# Patient Record
Sex: Male | Born: 1970 | Race: White | Hispanic: No | Marital: Married | Smoking: Current every day smoker
Health system: Southern US, Community
[De-identification: ages and names within clinical notes are randomized; demographics above are authoritative.]

## PROBLEM LIST (undated history)

## (undated) DIAGNOSIS — G8929 Other chronic pain: Secondary | ICD-10-CM

## (undated) DIAGNOSIS — M549 Dorsalgia, unspecified: Secondary | ICD-10-CM

---

## 2002-08-20 ENCOUNTER — Encounter: Payer: Self-pay | Admitting: Emergency Medicine

## 2002-08-20 ENCOUNTER — Emergency Department (HOSPITAL_COMMUNITY): Admission: EM | Admit: 2002-08-20 | Discharge: 2002-08-20 | Payer: Self-pay | Admitting: Emergency Medicine

## 2002-09-15 ENCOUNTER — Emergency Department (HOSPITAL_COMMUNITY): Admission: EM | Admit: 2002-09-15 | Discharge: 2002-09-15 | Payer: Self-pay | Admitting: Emergency Medicine

## 2002-09-15 ENCOUNTER — Encounter: Payer: Self-pay | Admitting: Emergency Medicine

## 2003-09-06 ENCOUNTER — Emergency Department (HOSPITAL_COMMUNITY): Admission: EM | Admit: 2003-09-06 | Discharge: 2003-09-06 | Payer: Self-pay | Admitting: Emergency Medicine

## 2003-11-30 ENCOUNTER — Emergency Department (HOSPITAL_COMMUNITY): Admission: EM | Admit: 2003-11-30 | Discharge: 2003-12-01 | Payer: Self-pay | Admitting: Emergency Medicine

## 2003-12-04 ENCOUNTER — Ambulatory Visit (HOSPITAL_COMMUNITY): Admission: RE | Admit: 2003-12-04 | Discharge: 2003-12-04 | Payer: Self-pay | Admitting: Internal Medicine

## 2003-12-04 ENCOUNTER — Encounter: Admission: RE | Admit: 2003-12-04 | Discharge: 2003-12-04 | Payer: Self-pay | Admitting: Internal Medicine

## 2003-12-13 ENCOUNTER — Encounter: Admission: RE | Admit: 2003-12-13 | Discharge: 2003-12-13 | Payer: Self-pay | Admitting: Internal Medicine

## 2005-04-05 ENCOUNTER — Emergency Department (HOSPITAL_COMMUNITY): Admission: EM | Admit: 2005-04-05 | Discharge: 2005-04-05 | Payer: Self-pay | Admitting: *Deleted

## 2005-04-08 ENCOUNTER — Emergency Department (HOSPITAL_COMMUNITY): Admission: EM | Admit: 2005-04-08 | Discharge: 2005-04-09 | Payer: Self-pay | Admitting: Emergency Medicine

## 2010-12-29 ENCOUNTER — Emergency Department (HOSPITAL_COMMUNITY): Payer: Self-pay

## 2010-12-29 ENCOUNTER — Emergency Department (HOSPITAL_COMMUNITY)
Admission: EM | Admit: 2010-12-29 | Discharge: 2010-12-29 | Disposition: A | Discharge status: Home / Self Care | Payer: Self-pay | Attending: General Surgery | Admitting: General Surgery

## 2010-12-29 DIAGNOSIS — W1789XA Other fall from one level to another, initial encounter: Secondary | ICD-10-CM | POA: Insufficient documentation

## 2010-12-29 DIAGNOSIS — S0120XA Unspecified open wound of nose, initial encounter: Secondary | ICD-10-CM | POA: Insufficient documentation

## 2010-12-29 DIAGNOSIS — S4350XA Sprain of unspecified acromioclavicular joint, initial encounter: Secondary | ICD-10-CM | POA: Insufficient documentation

## 2010-12-29 DIAGNOSIS — S42109A Fracture of unspecified part of scapula, unspecified shoulder, initial encounter for closed fracture: Secondary | ICD-10-CM | POA: Insufficient documentation

## 2010-12-29 LAB — COMPREHENSIVE METABOLIC PANEL
ALT: 14 U/L (ref 0–53)
AST: 28 U/L (ref 0–37)
CO2: 22 meq/L (ref 19–32)
Chloride: 108 meq/L (ref 96–112)
Creatinine, Ser: 1.1 mg/dL (ref 0.4–1.5)
GFR calc Af Amer: >60 mL/min (ref >60)
GFR calc non Af Amer: >60 mL/min (ref >60)
Glucose, Bld: 230 mg/dL — ABNORMAL HIGH (ref 70–99)
Total Bilirubin: 0.4 mg/dL (ref 0.3–1.2)

## 2010-12-29 LAB — CBC
HCT: 38.7 % — ABNORMAL LOW (ref 39.0–52.0)
Hemoglobin: 13.1 g/dL (ref 13.0–17.0)
MCH: 29.6 pg (ref 26.0–34.0)
RBC: 4.43 MIL/uL (ref 4.22–5.81)

## 2010-12-29 MED ORDER — IOHEXOL 300 MG/ML  SOLN
75.0000 mL | Freq: Once | INTRAMUSCULAR | Status: AC | PRN
  Administered 2010-12-29: 75 mL via INTRAVENOUS

## 2011-01-25 ENCOUNTER — Emergency Department (HOSPITAL_COMMUNITY)
Admission: EM | Admit: 2011-01-25 | Discharge: 2011-01-25 | Disposition: A | Discharge status: Home / Self Care | Payer: No Typology Code available for payment source | Attending: Emergency Medicine | Admitting: Emergency Medicine

## 2011-01-25 ENCOUNTER — Emergency Department (HOSPITAL_COMMUNITY): Payer: No Typology Code available for payment source

## 2011-01-25 DIAGNOSIS — M25519 Pain in unspecified shoulder: Secondary | ICD-10-CM | POA: Insufficient documentation

## 2011-01-25 DIAGNOSIS — W208XXA Other cause of strike by thrown, projected or falling object, initial encounter: Secondary | ICD-10-CM | POA: Insufficient documentation

## 2011-01-25 DIAGNOSIS — M545 Low back pain, unspecified: Secondary | ICD-10-CM | POA: Insufficient documentation

## 2011-01-25 DIAGNOSIS — M542 Cervicalgia: Secondary | ICD-10-CM | POA: Insufficient documentation

## 2011-01-25 DIAGNOSIS — S0990XA Unspecified injury of head, initial encounter: Secondary | ICD-10-CM | POA: Insufficient documentation

## 2011-01-25 DIAGNOSIS — Z79899 Other long term (current) drug therapy: Secondary | ICD-10-CM | POA: Insufficient documentation

## 2011-01-25 DIAGNOSIS — M26609 Unspecified temporomandibular joint disorder, unspecified side: Secondary | ICD-10-CM | POA: Insufficient documentation

## 2011-01-25 DIAGNOSIS — G8929 Other chronic pain: Secondary | ICD-10-CM | POA: Insufficient documentation

## 2011-01-25 DIAGNOSIS — R51 Headache: Secondary | ICD-10-CM | POA: Insufficient documentation

## 2012-02-12 ENCOUNTER — Emergency Department (HOSPITAL_BASED_OUTPATIENT_CLINIC_OR_DEPARTMENT_OTHER)
Admission: EM | Admit: 2012-02-12 | Discharge: 2012-02-12 | Disposition: A | Discharge status: Home / Self Care | Payer: Self-pay | Attending: Emergency Medicine | Admitting: Emergency Medicine

## 2012-02-12 ENCOUNTER — Encounter (HOSPITAL_BASED_OUTPATIENT_CLINIC_OR_DEPARTMENT_OTHER): Payer: Self-pay

## 2012-02-12 DIAGNOSIS — F17200 Nicotine dependence, unspecified, uncomplicated: Secondary | ICD-10-CM | POA: Insufficient documentation

## 2012-02-12 DIAGNOSIS — K047 Periapical abscess without sinus: Secondary | ICD-10-CM | POA: Insufficient documentation

## 2012-02-12 DIAGNOSIS — K029 Dental caries, unspecified: Secondary | ICD-10-CM | POA: Insufficient documentation

## 2012-02-12 MED ORDER — HYDROCODONE-ACETAMINOPHEN 5-500 MG PO TABS: 1.0000 | ORAL_TABLET | Freq: Four times a day (QID) | ORAL | Status: AC | PRN

## 2012-02-12 MED ORDER — CLINDAMYCIN HCL 150 MG PO CAPS: 150.0000 mg | ORAL_CAPSULE | Freq: Four times a day (QID) | ORAL | Status: AC

## 2012-02-12 NOTE — Discharge Instructions (Signed)
Abscessed Tooth A tooth abscess is a collection of infected fluid (pus) from a bacterial infection in the inner part of the tooth (pulp). It usually occurs at the end of the tooth's root.  CAUSES   A very bad cavity (extensive tooth decay).   Trauma to the tooth, such as a broken or chipped tooth, that allows bacteria to enter into the pulp.  SYMPTOMS  Severe pain in and around the infected tooth.   Swelling and redness around the abscessed tooth or in the mouth or face.   Tenderness.   Pus drainage.   Bad breath.   Bitter taste in the mouth.   Difficulty swallowing.   Difficulty opening the mouth.   Feeling sick to your stomach (nauseous).   Vomiting.   Chills.   Swollen neck glands.  DIAGNOSIS  A medical and dental history will be taken.   An examination will be performed by tapping on the abscessed tooth.   X-rays may be taken of the tooth to identify the abscess.  TREATMENT The goal of treatment is to eliminate the infection.   You may be prescribed antibiotic medicine to stop the infection from spreading.   A root canal may be performed to save the tooth. If the tooth cannot be saved, it may be pulled (extracted) and the abscess may be drained.  HOME CARE INSTRUCTIONS  Only take over-the-counter or prescription medicines for pain, fever, or discomfort as directed by your caregiver.   Do not drive after taking pain medicine (narcotics).   Rinse your mouth (gargle) often with salt water ( tsp salt in 8 oz of warm water) to relieve pain or swelling.   Do not apply heat to the outside of your face.   Return to your dentist for further treatment as directed.  SEEK IMMEDIATE DENTAL CARE IF:  You have a temperature by mouth above 102 F (38.9 C), not controlled by medicine.   You have chills or a very bad headache.   You have problems breathing or swallowing.   Your have trouble opening your mouth.   You develop swelling in the neck or around the eye.    Your pain is not helped by medicine.   Your pain is getting worse instead of better.  Document Released: 10/04/2005 Document Revised: 09/23/2011 Document Reviewed: 01/12/2011 Exit[employer] Patient Information 2012 [employer]. 

## 2012-02-12 NOTE — ED Notes (Signed)
Pt states that he has a tooth that needs to be pulled, presents with obvious swelling to R upper jaw/face.  Pt states that he does not have a dentist and has not sought care for this abscess/toothache prior to today.  Pt states that he cannot afford dental care.

## 2012-02-12 NOTE — ED Provider Notes (Signed)
Medical screening examination/treatment/procedure(s) were performed by non-physician practitioner and as supervising physician I was immediately available for consultation/collaboration.  Doug Sou, MD 02/12/12 1539

## 2012-02-12 NOTE — ED Provider Notes (Signed)
History     CSN: [id number]  Arrival date & time 02/12/12  1131   First MD Initiated Contact with Patient 02/12/12 1159      Chief Complaint  Patient presents with  . Abscess    (Consider location/radiation/quality/duration/timing/severity/associated sxs/prior treatment) HPI Comments: Pt states that he had a problem previously with right upper tooth, and now has swelling to the cheek:pt denies fever:pt states that he doesn't have a dentist  Patient is a 41 y.o. male presenting with abscess. The history is provided by the patient. No language interpreter was used.  Abscess  This is a new problem. The current episode started yesterday. The problem occurs continuously. The problem has been rapidly worsening. The abscess is present on the face. The abscess is characterized by painfulness and swelling. It is unknown what he was exposed to.    History reviewed. No pertinent past medical history.  History reviewed. No pertinent past surgical history.  History reviewed. No pertinent family history.  History  Substance Use Topics  . Smoking status: Current Everyday Smoker -- 1.0 packs/day    Types: Cigarettes  . Smokeless tobacco: Current User  . Alcohol Use: No      Review of Systems  Constitutional: Negative.   Eyes: Negative.   Respiratory: Negative.   Cardiovascular: Negative.   Neurological: Negative.     Allergies  Review of patient's allergies indicates no known allergies.  Home Medications   Current Outpatient Rx  Name Route Sig Dispense Refill  . DIAZEPAM 5 MG PO TABS Oral Take 5 mg by mouth daily as needed.    . OXYCODONE HCL ER 20 MG PO TB12 Oral Take 20 mg by mouth 3 (three) times daily as needed.      BP 105/66  Pulse 80  Temp(Src) 97.5 F (36.4 C) (Oral)  Resp 18  Ht 6' (1.829 m)  Wt 180 lb (81.647 kg)  BMI 24.41 kg/m2  SpO2 98%  Physical Exam  Nursing note and vitals reviewed. Constitutional: He is oriented to person, place, and time. He  appears well-developed and well-nourished.  HENT:       Pt has swelling noted to the right cheek:no obvious:pt has generalized decay without definitive tooth  Eyes: Conjunctivae and EOM are normal.  Neck: Neck supple.  Cardiovascular: Normal rate and regular rhythm.   Pulmonary/Chest: Effort normal and breath sounds normal.  Musculoskeletal: Normal range of motion.  Neurological: He is alert and oriented to person, place, and time.    ED Course  Procedures (including critical care time)  Labs Reviewed - No data to display No results found.   1. Tooth abscess       MDM  Pt is not having any respiratory involvement:pt is okay to follow up with the dentist:pt treated with antibiotics:pt is already on pain medication:pt now saying he has no pain medication at home:will treat       Teressa Lower, NP 02/12/12 1217  Teressa Lower, NP 02/12/12 1221

## 2012-03-14 ENCOUNTER — Encounter (HOSPITAL_BASED_OUTPATIENT_CLINIC_OR_DEPARTMENT_OTHER): Payer: Self-pay | Admitting: *Deleted

## 2012-03-14 ENCOUNTER — Emergency Department (HOSPITAL_BASED_OUTPATIENT_CLINIC_OR_DEPARTMENT_OTHER): Payer: Self-pay

## 2012-03-14 ENCOUNTER — Emergency Department (HOSPITAL_BASED_OUTPATIENT_CLINIC_OR_DEPARTMENT_OTHER)
Admission: EM | Admit: 2012-03-14 | Discharge: 2012-03-14 | Disposition: A | Discharge status: Home / Self Care | Payer: Self-pay | Attending: Emergency Medicine | Admitting: Emergency Medicine

## 2012-03-14 DIAGNOSIS — L02512 Cutaneous abscess of left hand: Secondary | ICD-10-CM

## 2012-03-14 DIAGNOSIS — IMO0001 Reserved for inherently not codable concepts without codable children: Secondary | ICD-10-CM | POA: Insufficient documentation

## 2012-03-14 DIAGNOSIS — L02519 Cutaneous abscess of unspecified hand: Secondary | ICD-10-CM | POA: Insufficient documentation

## 2012-03-14 DIAGNOSIS — R609 Edema, unspecified: Secondary | ICD-10-CM | POA: Insufficient documentation

## 2012-03-14 DIAGNOSIS — F17200 Nicotine dependence, unspecified, uncomplicated: Secondary | ICD-10-CM | POA: Insufficient documentation

## 2012-03-14 MED ORDER — VANCOMYCIN HCL IN DEXTROSE 1-5 GM/200ML-% IV SOLN
1000.0000 mg | Freq: Once | INTRAVENOUS | Status: AC
  Administered 2012-03-14: 1000 mg via INTRAVENOUS
  Filled 2012-03-14: qty 200

## 2012-03-14 MED ORDER — CEPHALEXIN 500 MG PO CAPS: 500.0000 mg | ORAL_CAPSULE | Freq: Four times a day (QID) | ORAL | Status: AC

## 2012-03-14 MED ORDER — SULFAMETHOXAZOLE-TMP DS 800-160 MG PO TABS: 1.0000 | ORAL_TABLET | Freq: Two times a day (BID) | ORAL | Status: AC

## 2012-03-14 MED ORDER — DEXTROSE 5 % IV SOLN
1.0000 g | INTRAVENOUS | Status: DC
  Administered 2012-03-14: 1 g via INTRAVENOUS
  Filled 2012-03-14: qty 10

## 2012-03-14 MED ORDER — LIDOCAINE-EPINEPHRINE 2 %-1:100000 IJ SOLN
INTRAMUSCULAR | Status: AC
  Filled 2012-03-14: qty 1

## 2012-03-14 MED ORDER — LIDOCAINE-EPINEPHRINE 2 %-1:100000 IJ SOLN: 20.0000 mL | Freq: Once | INTRAMUSCULAR | Status: DC

## 2012-03-14 NOTE — Consult Note (Signed)
  Patient was consent for surgery and underwent a field block with 20 cc of lidocaine with epinephrine. Following this he was prepped and draped with Betadine scrub and paint. Following this he was sterilely draped and outlined marks were made.  Once this was complete the patient underwent knife blade incision 1 inch in nature. The patient had obvious power reluctant deep abscess was evacuated. I performed careful irrigation debridement and removal of previous chronic and necrotic tissue. His muscle fibers appeared to be intact.  There were no signs of ascending lymphangitis. There no signs of compartment syndrome. There no signs of necrotizing fasciitis.  After his irrigation and debridement his wound was packed aggressively. I did perform a limited tenosynovitis me of the tendon apparatus the did not leave any exposed tendons. The wound was packed nicely.  Procedure irrigation and debridement of the deep abscess left hand #1 Tenosynovectomy left index finger extensor tendons #2  He'll followup in my office tomorrow for dressing change further irrigation. He understands no work. 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 I discussed with him I would not recommend IV or by mouth pain medicine that is mycotic at this point due to his addicted issues. The patient fully understands. He'll be discharged home on Keflex as well as Bactrim.  Dominica Severin M.D.

## 2012-03-14 NOTE — ED Notes (Signed)
Left hand is red, hot to touch, swollen and painful x 3 days.

## 2012-03-14 NOTE — Consult Note (Signed)
Reason for Consult:infection left hand Referring Physician: ER staff  Todd Richmond is an 41 y.o. male.  HPI: .Patient presents for evaluation and treatment of the of their upper extremity predicament. The patient denies neck back chest or of abdominal pain. The patient notes that they have no lower extremity problems. The patient from primarily complains of the upper extremity pain noted.  The patient's of left hand infection came about after he injected heroin in the first webspace dorsally about the hand. The injection was 3 days ago. He presents with significant pain.  History reviewed. No pertinent past medical history.  History reviewed. No pertinent past surgical history.  No family history on file.  Social History:  reports that he has been smoking Cigarettes.  He has been smoking about 1 pack per day. He uses smokeless tobacco. He reports that he does not drink alcohol or use illicit drugs.  Allergies: No Known Allergies  Medications: I have reviewed the patient's current medications.  No results found for this or any previous visit (from the past 48 hour(s)).  Dg Hand Complete Left  03/14/2012  *RADIOLOGY REPORT*  Clinical Data: Abscess.  LEFT HAND - COMPLETE 3+ VIEW  Comparison: None.  Findings: Soft tissue swelling over the dorsum of the hand on the lateral view.  No underlying bony abnormality.  No fracture, subluxation or dislocation.  IMPRESSION: Posterior soft tissue swelling.  No acute bony abnormality.  Original Report Authenticated By: Cyndie Chime, M.D.    Review of Systems  Constitutional: Negative.   HENT: Negative.   Eyes: Negative.   Cardiovascular: Negative.   Gastrointestinal: Negative.   Genitourinary: Negative.   Skin: Negative.   Neurological: Negative.   Endo/Heme/Allergies: Negative.   Psychiatric/Behavioral: Negative.    Blood pressure 122/60, pulse 102, temperature 98.2 F (36.8 C), temperature source Oral, resp. rate 20, SpO2 98.00%. Physical  Exam..The patient is alert and oriented in no acute distress the patient complains of pain in the affected upper extremity. The patient is noted to have a normal HEENT exam. Lung fields show equal chest expansion and no shortness of breath abdomen exam is nontender without distention. Lower extremity examination does not show any fracture dislocation or blood clot symptoms. Pelvis is stable neck and back are stable and nontender  patient has a area of redness  and edema and likely abscess about the left dorsal hand where he injected here with a needle. He states the needle was clean however his hand was dirty. He notes no other injury. I discussed him all issues at length. He does not have any frank Kanavel signs but does have noted pain with palpation and what appears to be an obvious abscess. I discussed these issues with him at length.  Assessment/Plan: Marland KitchenMarland KitchenWe are planning surgery for your upper extremity. The risk and benefits of surgery include risk of bleeding infection anesthesia damage to normal structures and failure of the surgery to accomplish its intended goals of relieving symptoms and restoring function with this in mind we'll going to proceed. I have specifically discussed with the patient the pre-and postoperative regime and the does and don'ts and risk and benefits in great detail. Risk and benefits of surgery also include risk of dystrophy chronic nerve pain failure of the healing process to go onto completion and other inherent risks of surgery The relavent the pathophysiology of the disease/injury process, as well as the alternatives for treatment and postoperative course of action has been discussed in great detail with the patient  who desires to proceed.  We will do everything in our power to help you (the patient) restore function to the upper extremity. Is a pleasure to see this patient today.   [name],Tykel Badie M 03/14/2012, 5:00 PM

## 2012-03-14 NOTE — ED Provider Notes (Signed)
History     CSN: [id number]  Arrival date & time 03/14/12  1346   First MD Initiated Contact with Patient 03/14/12 1419      Chief Complaint  Patient presents with  . Abscess    (Consider location/radiation/quality/duration/timing/severity/associated sxs/prior treatment) Patient is a 41 y.o. male presenting with hand pain. The history is provided by the patient. No language interpreter was used.  Hand Pain This is a new problem. The current episode started in the past 7 days. The problem occurs constantly. The problem has been gradually worsening. Associated symptoms include joint swelling and myalgias. The symptoms are aggravated by bending. He has tried nothing for the symptoms. The treatment provided no relief.  Pt complains of a red swollen area to right hand.  Pt admits to using a vein in this area to inject heroin  History reviewed. No pertinent past medical history.  History reviewed. No pertinent past surgical history.  No family history on file.  History  Substance Use Topics  . Smoking status: Current Everyday Smoker -- 1.0 packs/day    Types: Cigarettes  . Smokeless tobacco: Current User  . Alcohol Use: No      Review of Systems  Musculoskeletal: Positive for myalgias and joint swelling.  Skin: Positive for color change.  All other systems reviewed and are negative.    Allergies  Review of patient's allergies indicates no known allergies.  Home Medications   Current Outpatient Rx  Name Route Sig Dispense Refill  . DIAZEPAM 5 MG PO TABS Oral Take 5 mg by mouth daily as needed.    . OXYCODONE HCL ER 20 MG PO TB12 Oral Take 20 mg by mouth 3 (three) times daily as needed.      BP 122/60  Pulse 102  Temp(Src) 98.2 F (36.8 C) (Oral)  Resp 20  SpO2 98%  Physical Exam  Nursing note and vitals reviewed. Constitutional: He is oriented to person, place, and time. He appears well-developed and well-nourished.  HENT:  Head: Normocephalic and atraumatic.   Musculoskeletal: He exhibits edema and tenderness.       Swollen dorsal left hand between 1st and 2nd fingers,  Warm to touch  Neurological: He is alert and oriented to person, place, and time. He has normal reflexes.  Skin: There is erythema.  Psychiatric: He has a normal mood and affect.    ED Course  Procedures (including critical care time)  Labs Reviewed - No data to display Dg Hand Complete Left  03/14/2012  *RADIOLOGY REPORT*  Clinical Data: Abscess.  LEFT HAND - COMPLETE 3+ VIEW  Comparison: None.  Findings: Soft tissue swelling over the dorsum of the hand on the lateral view.  No underlying bony abnormality.  No fracture, subluxation or dislocation.  IMPRESSION: Posterior soft tissue swelling.  No acute bony abnormality.  Original Report Authenticated By: Cyndie Chime, M.D.     No diagnosis found.    MDM  IV NS,  Pt given rocephin and vancomycin.   I spoke to Dr. Amanda Pea who will see here        Elson Areas, [place] 03/14/12 [phone]

## 2012-03-14 NOTE — ED Notes (Signed)
Patient admits to injection heroin into his hand several times over the last month.  States his hand was red and sore after an injection and he reinjected multiple times since.

## 2012-03-14 NOTE — Discharge Instructions (Signed)
Abscess An abscess (boil or furuncle) is an infected area that contains a collection of pus.  SYMPTOMS Signs and symptoms of an abscess include pain, tenderness, redness, or hardness. You may feel a moveable soft area under your skin. An abscess can occur anywhere in the body.  TREATMENT  A surgical cut (incision) may be made over your abscess to drain the pus. Gauze may be packed into the space or a drain may be looped through the abscess cavity (pocket). This provides a drain that will allow the cavity to heal from the inside outwards. The abscess may be painful for a few days, but should feel much better if it was drained.  Your abscess, if seen early, may not have localized and may not have been drained. If not, another appointment may be required if it does not get better on its own or with medications. HOME CARE INSTRUCTIONS   Only take over-the-counter or prescription medicines for pain, discomfort, or fever as directed by your caregiver.   Take your antibiotics as directed if they were prescribed. Finish them even if you start to feel better.   Keep the skin and clothes clean around your abscess.   If the abscess was drained, you will need to use gauze dressing to collect any draining pus. Dressings will typically need to be changed 3 or more times a day.   The infection may spread by skin contact with others. Avoid skin contact as much as possible.   Practice good hygiene. This includes regular hand washing, cover any draining skin lesions, and do not share personal care items.   If you participate in sports, do not share athletic equipment, towels, whirlpools, or personal care items. Shower after every practice or tournament.   If a draining area cannot be adequately covered:   Do not participate in sports.   Children should not participate in day care until the wound has healed or drainage stops.   If your caregiver has given you a follow-up appointment, it is very important  to keep that appointment. Not keeping the appointment could result in a much worse infection, chronic or permanent injury, pain, and disability. If there is any problem keeping the appointment, you must call back to this facility for assistance.  SEEK MEDICAL CARE IF:   You develop increased pain, swelling, redness, drainage, or bleeding in the wound site.   You develop signs of generalized infection including muscle aches, chills, fever, or a general ill feeling.   You have an oral temperature above 102 F (38.9 C).  MAKE SURE YOU:   Understand these instructions.   Will watch your condition.   Will get help right away if you are not doing well or get worse.  Document Released: 07/14/2005 Document Revised: 09/23/2011 Document Reviewed: 05/07/2008 ExitCare Patient Information 2012 [place] [place] [facility]  We recommend that you to take vitamin C 1000 mg a day to promote healing we also recommend that if you require her pain medicine that he take a stool softener to prevent constipation as most pain medicines will have constipation side effects. We recommend either Peri-Colace or Senokot and recommend that you also consider adding MiraLAX to prevent the constipation affects from pain medicine if you are required to use them. These medicines are over the counter and maybe purchased at a local pharmacy.  Keep bandage clean and dry.  Call for any problems.  No smoking.  Criteria for driving a car: you should be off your pain medicine for 7-8  hours, able to drive one handed(confident), thinking clearly and feeling able in your judgement to drive. Continue elevation as it will decrease swelling.  If instructed by MD move your fingers within the confines of the bandage/splint.  Use ice if instructed by your MD. Call immediately for any sudden loss of feeling in your hand/arm or change in functional abilities of the extremity.   [name]

## 2012-03-14 NOTE — Discharge Summary (Signed)
  Please see full consult and procedure note. Final diagnosis deep abscess left hand secondary to IV infiltration via a heroin needle  Dominica Severin M.D.

## 2012-03-15 NOTE — ED Provider Notes (Signed)
MSK: left hand with large erythematous area overlying dorsum of hand over webspace between thumb and index finger with associated TTP, induration and fluctuance extending proximally toward wrist   Medical screening examination/treatment/procedure(s) were conducted as a shared visit with non-physician practitioner(s) and myself.  I personally evaluated the patient during the encounter   Cyndra Numbers, MD 03/15/12 [phone]

## 2012-03-17 LAB — WOUND CULTURE
Gram Stain: NONE SEEN
Special Requests: NORMAL

## 2012-03-17 NOTE — ED Notes (Signed)
+   MRSA Chart sent to EDP office for review. 

## 2012-03-19 NOTE — ED Notes (Signed)
Attempt to call patient regarding + wound culture. Message left to call flow manager's #

## 2012-04-11 NOTE — ED Notes (Signed)
Unable to contact via phone,letter mailed to [facility] address.No response after 30 days.Chart closed out and sent to Medical Records.

## 2012-05-10 ENCOUNTER — Encounter (HOSPITAL_BASED_OUTPATIENT_CLINIC_OR_DEPARTMENT_OTHER): Payer: Self-pay | Admitting: *Deleted

## 2012-05-10 ENCOUNTER — Emergency Department (HOSPITAL_BASED_OUTPATIENT_CLINIC_OR_DEPARTMENT_OTHER): Payer: Self-pay

## 2012-05-10 ENCOUNTER — Emergency Department (HOSPITAL_BASED_OUTPATIENT_CLINIC_OR_DEPARTMENT_OTHER)
Admission: EM | Admit: 2012-05-10 | Discharge: 2012-05-10 | Disposition: A | Discharge status: Home / Self Care | Payer: Self-pay | Attending: Emergency Medicine | Admitting: Emergency Medicine

## 2012-05-10 DIAGNOSIS — M549 Dorsalgia, unspecified: Secondary | ICD-10-CM

## 2012-05-10 DIAGNOSIS — G8929 Other chronic pain: Secondary | ICD-10-CM | POA: Insufficient documentation

## 2012-05-10 DIAGNOSIS — F17200 Nicotine dependence, unspecified, uncomplicated: Secondary | ICD-10-CM | POA: Insufficient documentation

## 2012-05-10 HISTORY — DX: Dorsalgia, unspecified: M54.9

## 2012-05-10 HISTORY — DX: Other chronic pain: G89.29

## 2012-05-10 LAB — URINALYSIS, ROUTINE W REFLEX MICROSCOPIC
Glucose, UA: NEGATIVE mg/dL
Leukocytes, UA: NEGATIVE
pH: 5.5 (ref 5.0–8.0)

## 2012-05-10 MED ORDER — KETOROLAC TROMETHAMINE 60 MG/2ML IM SOLN
60.0000 mg | Freq: Once | INTRAMUSCULAR | Status: DC
  Filled 2012-05-10: qty 2

## 2012-05-10 NOTE — ED Notes (Signed)
Per Dwana Melena, RN, pt was not in room and gown was lying on bed when she entered room to discharge pt.

## 2012-05-10 NOTE — ED Notes (Signed)
Pt amb to room 3 with quick steady gait in nad. Pt reports chronic back pain for which he is prescribed "roxy 20's" by [facility], took this am with no relief.

## 2012-05-10 NOTE — ED Provider Notes (Signed)
History/physical exam/procedure(s) were performed by non-physician practitioner and as supervising physician I was immediately available for consultation/collaboration. I have reviewed all notes and am in agreement with care and plan.   Hilario Quarry, MD 05/10/12 [phone]

## 2012-05-10 NOTE — ED Provider Notes (Signed)
History     CSN: [id number]  Arrival date & time 05/10/12  1040   First MD Initiated Contact with Patient 05/10/12 1205      Chief Complaint  Patient presents with  . Back Pain    (Consider location/radiation/quality/duration/timing/severity/associated sxs/prior treatment) HPI Comments: Pt denies any new injury:pt states that his normal pain medication has not been working  Patient is a 41 y.o. male presenting with back pain. The history is provided by the patient. No language interpreter was used.  Back Pain  This is a chronic problem. The current episode started more than 2 days ago. The problem occurs constantly. The problem has not changed since onset.The pain is associated with no known injury. The pain is present in the thoracic spine. The quality of the pain is described as aching. The pain does not radiate. The pain is severe. The symptoms are aggravated by bending and twisting. The pain is the same all the time. Pertinent negatives include no fever, no numbness, no bowel incontinence, no pelvic pain, no tingling and no weakness. Treatments tried: roxicodone and valium.    Past Medical History  Diagnosis Date  . Chronic back pain     History reviewed. No pertinent past surgical history.  History reviewed. No pertinent family history.  History  Substance Use Topics  . Smoking status: Current Everyday Smoker -- 1.0 packs/day    Types: Cigarettes  . Smokeless tobacco: Current User  . Alcohol Use: No      Review of Systems  Constitutional: Negative.  Negative for fever.  Respiratory: Negative.   Cardiovascular: Negative.   Gastrointestinal: Negative for bowel incontinence.  Genitourinary: Negative for pelvic pain.  Musculoskeletal: Positive for back pain.  Neurological: Negative for tingling, weakness and numbness.    Allergies  Review of patient's allergies indicates no known allergies.  Home Medications   Current Outpatient Rx  Name Route Sig Dispense  Refill  . OXYCODONE HCL 30 MG PO TABS Oral Take 20 mg by mouth every 4 (four) hours as needed.    [name] DIAZEPAM 5 MG PO TABS Oral Take 5 mg by mouth daily as needed.      BP 122/78  Pulse 83  Temp 97.4 F (36.3 C) (Oral)  Resp 20  Ht 6\' 1"  (1.854 m)  Wt 190 lb (86.183 kg)  BMI 25.07 kg/m2  SpO2 98%  Physical Exam  Nursing note and vitals reviewed. Constitutional: He is oriented to person, place, and time. He appears well-developed.  HENT:  Head: Normocephalic and atraumatic.  Cardiovascular: Normal rate and regular rhythm.   Pulmonary/Chest: Effort normal and breath sounds normal.       Pt tender on the left lateral and posterior ribs  Musculoskeletal: Normal range of motion.  Neurological: He is alert and oriented to person, place, and time.  Skin: Skin is warm and dry.    ED Course  Procedures (including critical care time)  Labs Reviewed  URINALYSIS, ROUTINE W REFLEX MICROSCOPIC - Abnormal; Notable for the following:    Color, Urine AMBER (*)  BIOCHEMICALS MAY BE AFFECTED BY COLOR   APPearance CLOUDY (*)     Specific Gravity, Urine 1.036 (*)     All other components within normal limits   Dg Chest 2 View  05/10/2012  *RADIOLOGY REPORT*  Clinical Data: 41 year old male with left side chest pain.  CHEST - 2 VIEW  Comparison: 12/29/2010 and earlier.  Findings: Lung volumes are within normal limits. Normal cardiac size and mediastinal contours.  Visualized tracheal air column is within normal limits.  No pneumothorax or pleural effusion.  No pulmonary edema or confluent pulmonary opacity. No acute osseous abnormality identified.  Previously nondisplaced left scapula fracture no longer evident.  IMPRESSION: Negative, no acute cardiopulmonary abnormality.  Original Report Authenticated By: Harley Hallmark, M.D.   Dg Ribs Unilateral Left  05/10/2012  *RADIOLOGY REPORT*  Clinical Data: Rib pain  LEFT RIBS - 2 VIEW  Comparison: 05/10/2012  Findings: Dedicated views of the left ribs  show no displaced fracture.  IMPRESSION:  1.  Negative exam.  Original Report Authenticated By: Rosealee Albee, M.D.     1. Back pain       MDM  No acute findings:likely related to chronic pain that is already being treated:pt has no neuro deficits        Teressa Lower, NP 05/10/12 1454

## 2012-09-25 IMAGING — CR DG SHOULDER 2+V*L*
4 series · 4 of 4 positions shown · non-contrast
Comparison: Single view left shoulder 12/29/2010.

CLINICAL DATA: Blunt trauma.  Pain.

LEFT SHOULDER - 2+ VIEW

[t shoulder ap internal left (1 of 2)]
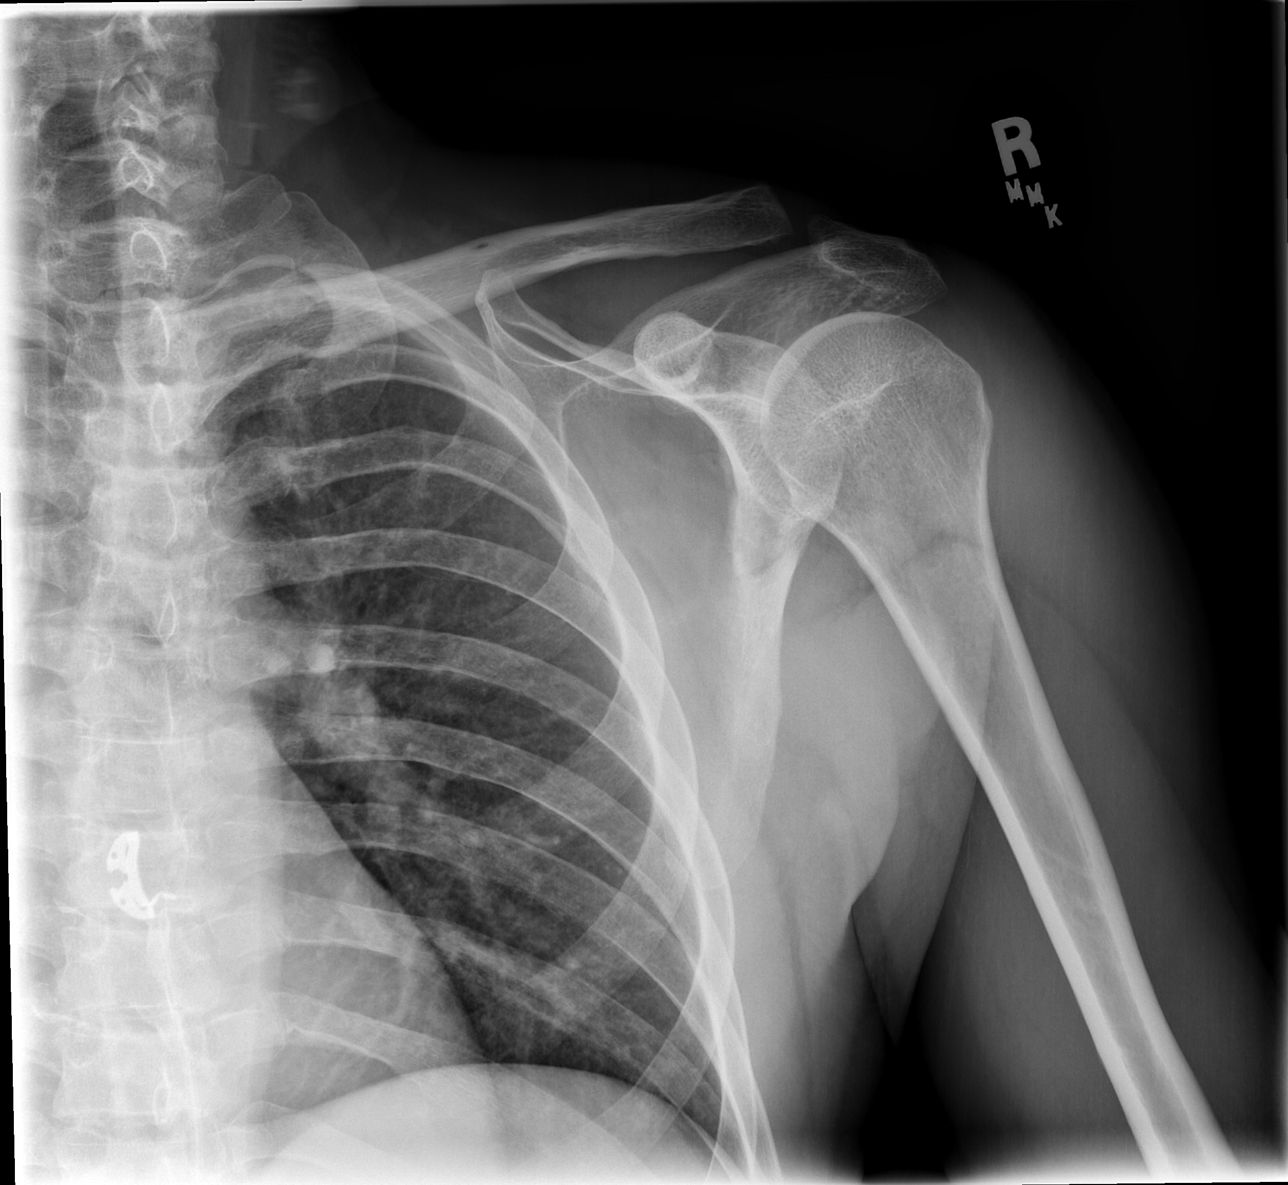

[t shoulder y view left *]
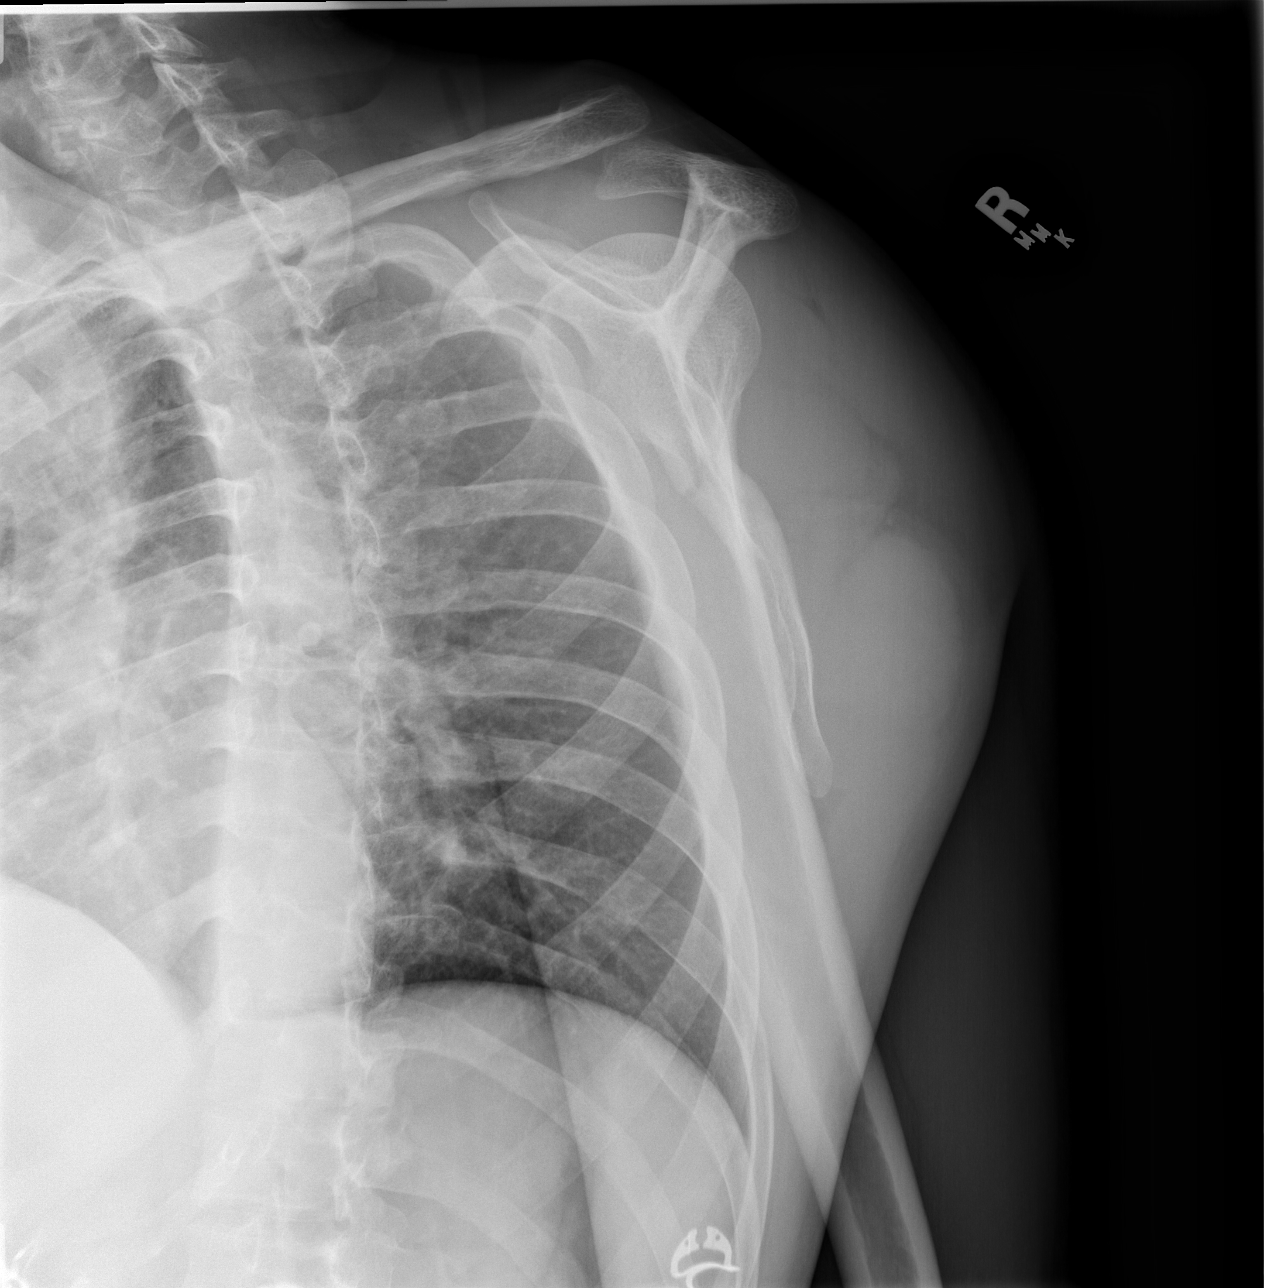

[t shoulder ap internal left (2 of 2)]
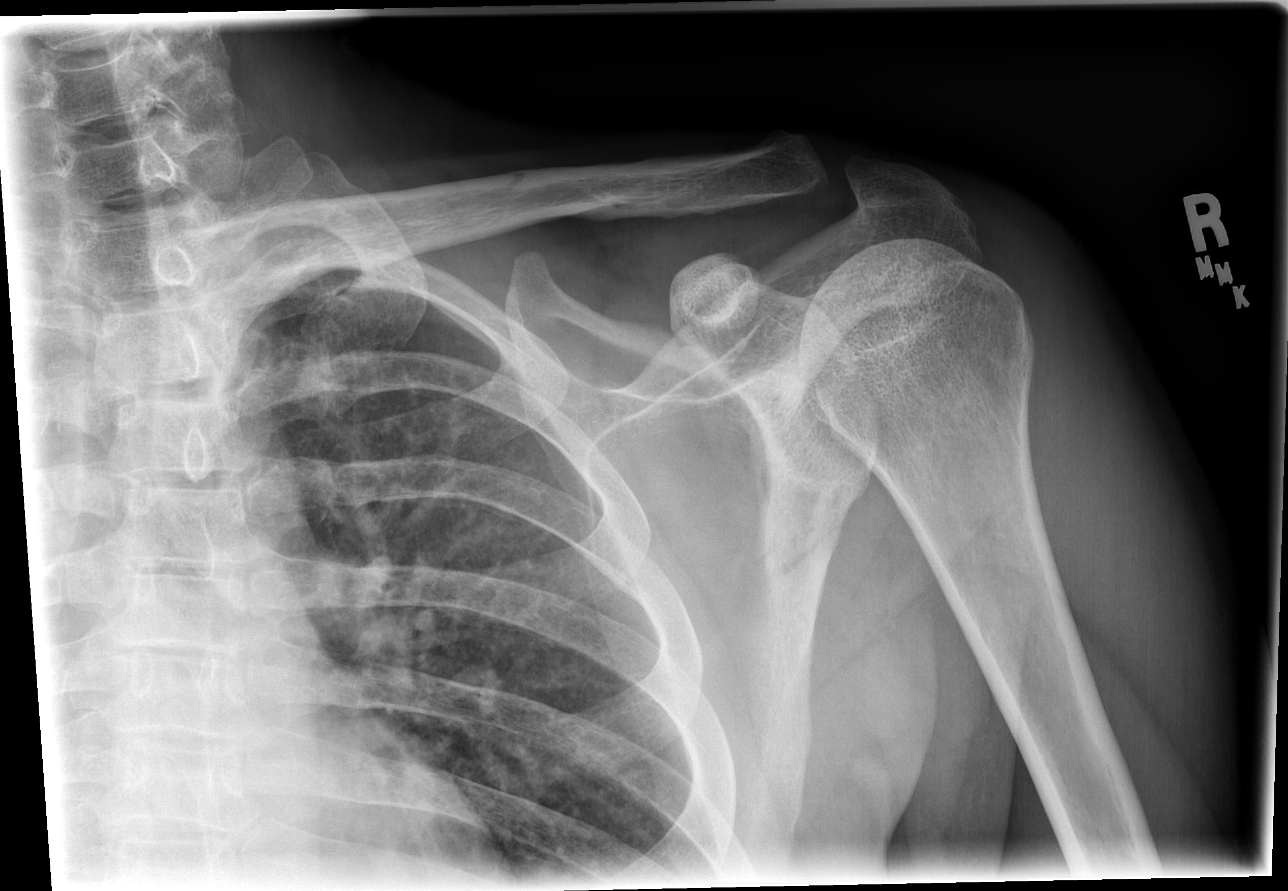

[t shoulder ap external left *]
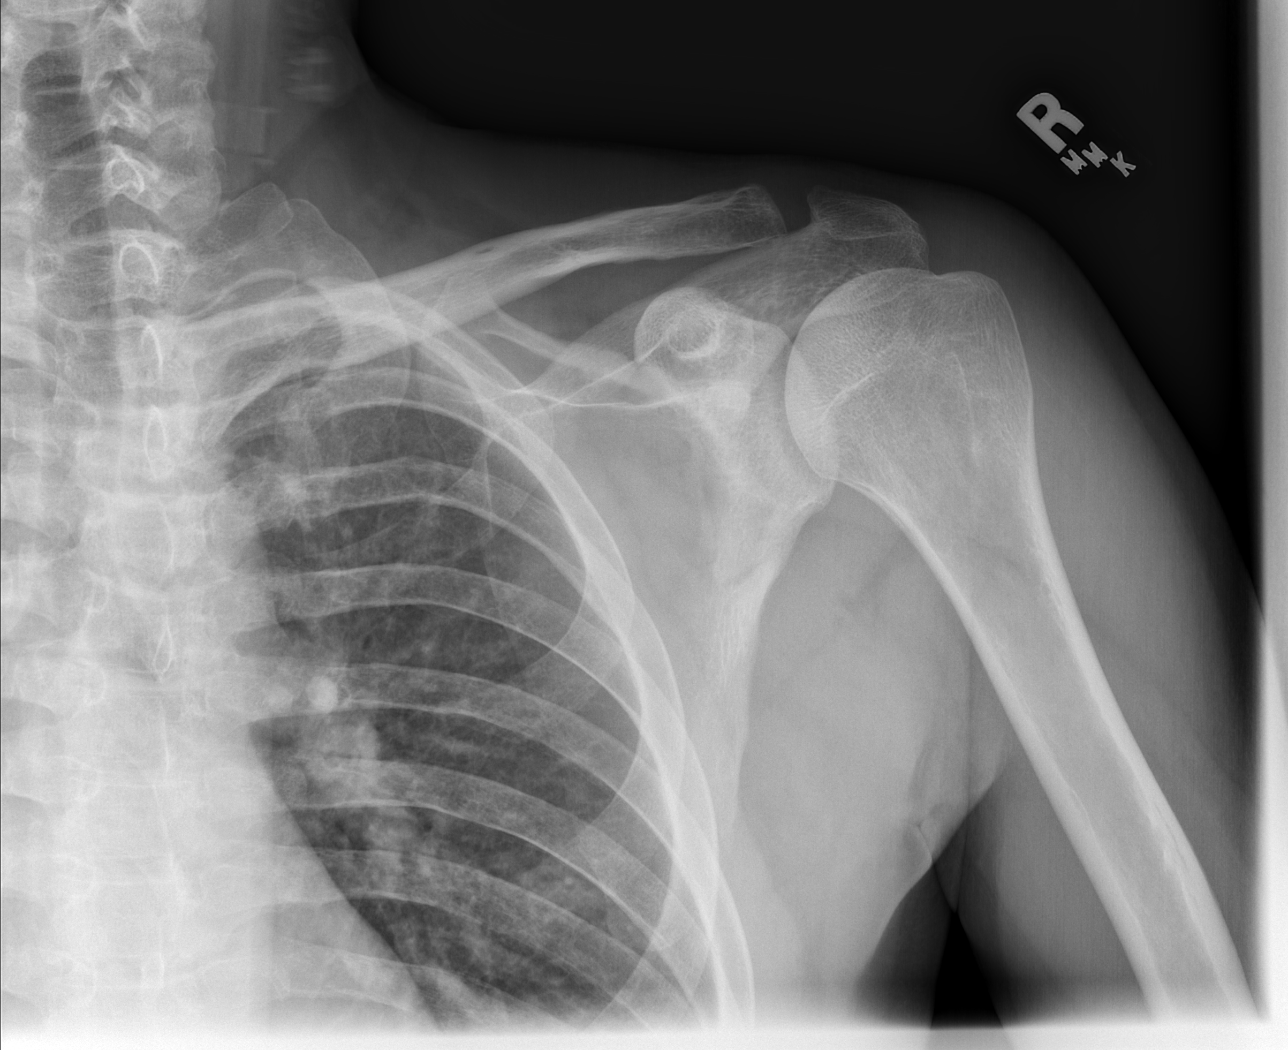

[4 of 4 positions shown; findings below may reference images not displayed]

FINDINGS: The patient has a fracture immediately inferior to the
base of the glenoid bone which is nondisplaced.  No other fracture
is identified.  On the internal rotation view, there is mild
elevation of the distal clavicle relative to the acromion.  Imaged
left lung and ribs appear normal.
IMPRESSION: 1.  Nondisplaced incomplete fracture of the scapula inferior to the
neck of the glenoid.
2.  Findings compatible with acromioclavicular joint separation.

## 2012-09-25 IMAGING — CT CT MAXILLOFACIAL W/O CM
5 of 9 series · 13 of 37 positions shown, 14 images · non-contrast
Comparison: None.

CT HEAD

CLINICAL DATA: Tree limb fell on the patient.  Headache, facial
pain, neck pain, facial abrasion.

CT HEAD WITHOUT CONTRAST
CT MAXILLOFACIAL WITHOUT CONTRAST
CT CERVICAL SPINE WITHOUT CONTRAST
TECHNIQUE: Multidetector CT imaging of the head, cervical spine,
and maxillofacial structures were performed using the standard
protocol without intravenous contrast. Multiplanar CT image
reconstructions of the cervical spine and maxillofacial structures
were also generated.

[Series 3: recon 2: brain · axial · 0.49mm/px · z∈[+360,+459]mm · 4 of 128 slices shown]
[im 26/128  bone]
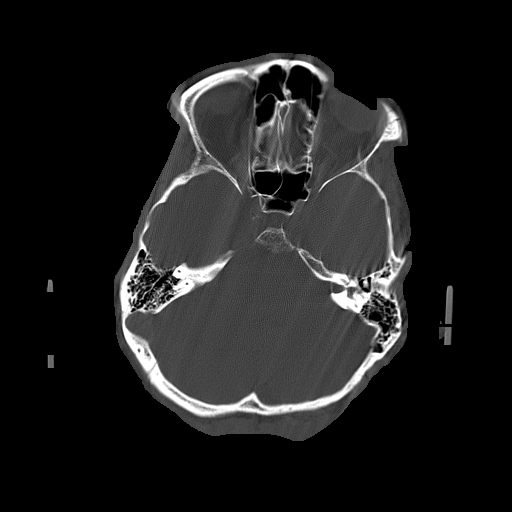
[im 51/128  bone]
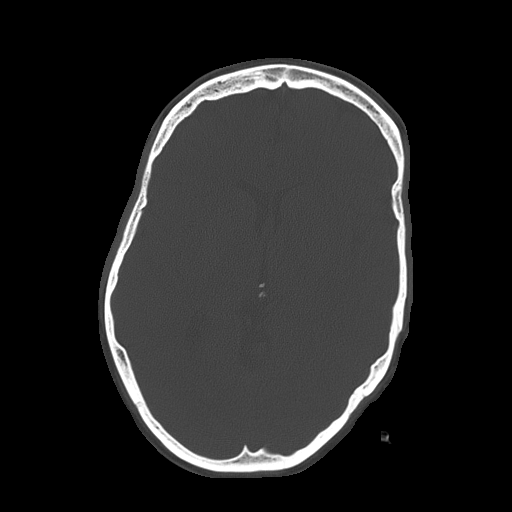
[im 77/128  bone]
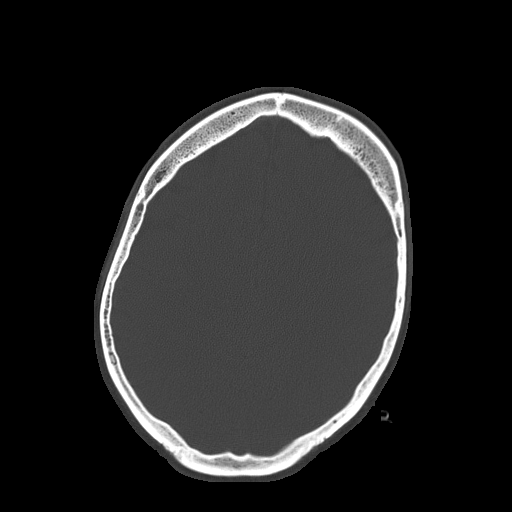
[im 102/128  bone]
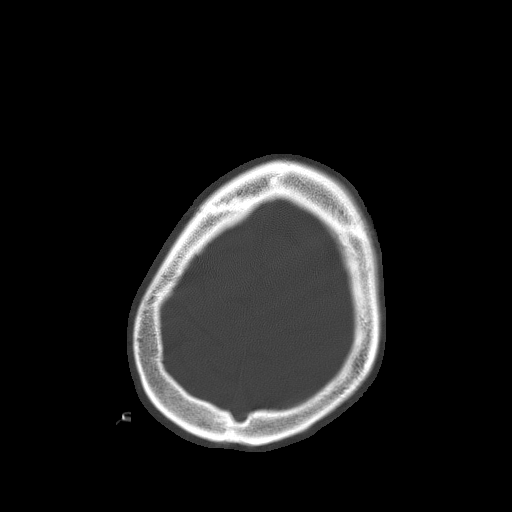

[Series 7: cervical spine · axial · 0.31mm/px · z∈[+170,+235]mm · 2 of 80 slices shown, 3 images]
[im 27/80  brain]
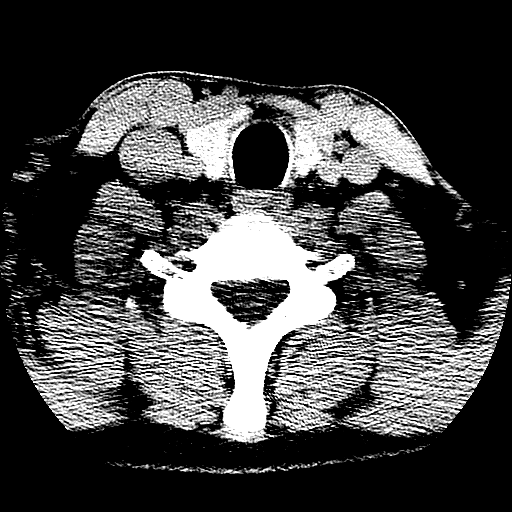
[im 27/80  bone]
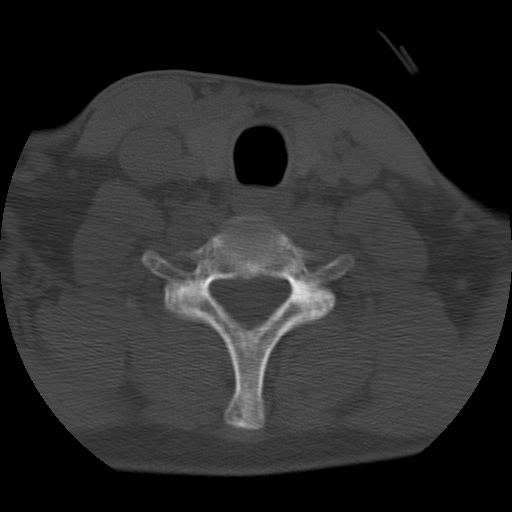
[im 53/80  bone]
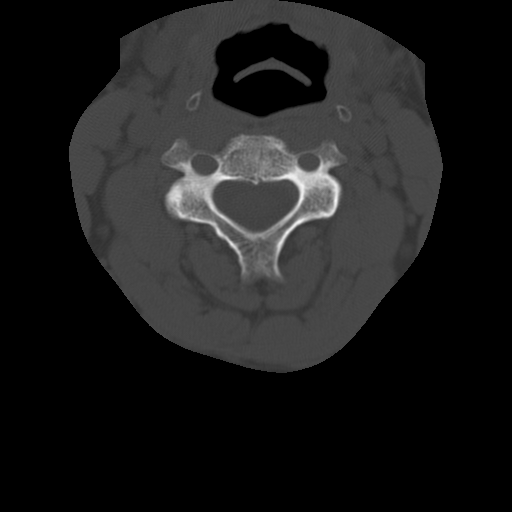

[Series 8: recon 2: cervical spine · axial · 0.31mm/px · z∈[+173,+238]mm · 2 of 79 slices shown]
[im 27/79  bone]
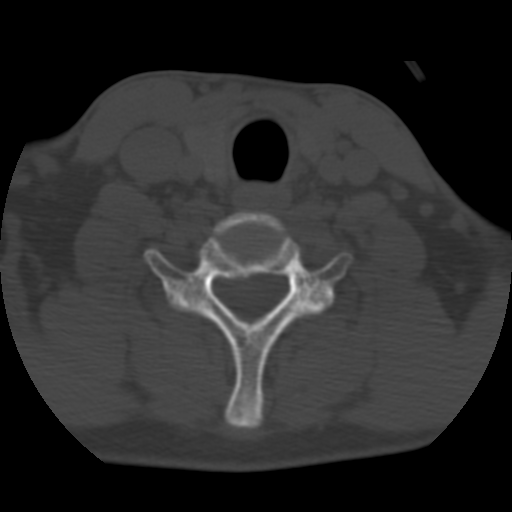
[im 53/79  bone]
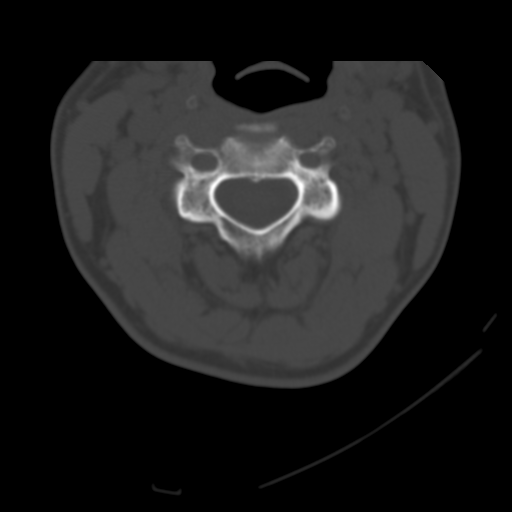

[Series 105: st cor · coronal · 0.40mm/px · 2 of 90 slices shown]
[im 30/90  bone]
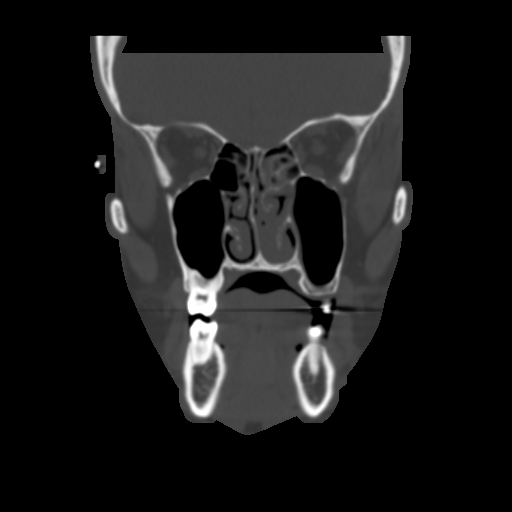
[im 60/90  bone]
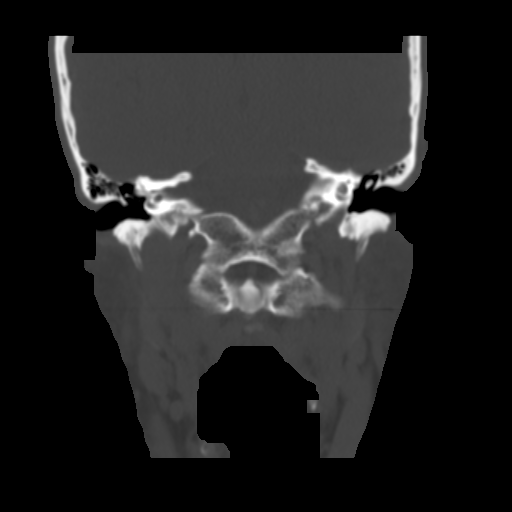

[Series 901: cor · coronal · 0.40mm/px · 3 of 44 slices shown]
[im 16/44  bone]
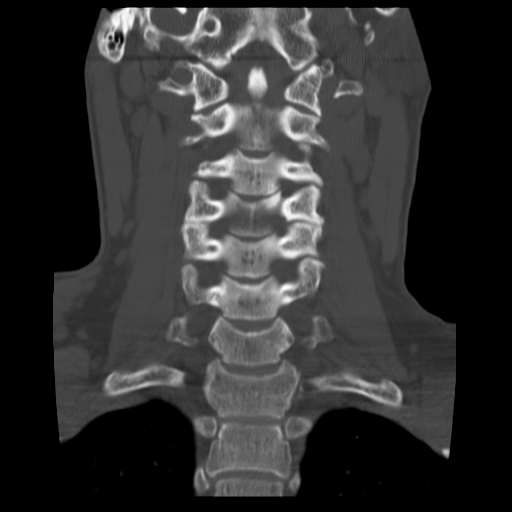
[im 20/44  bone]
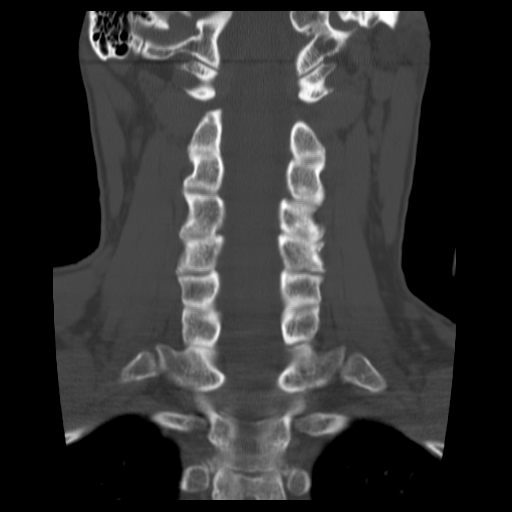
[im 24/44  bone]
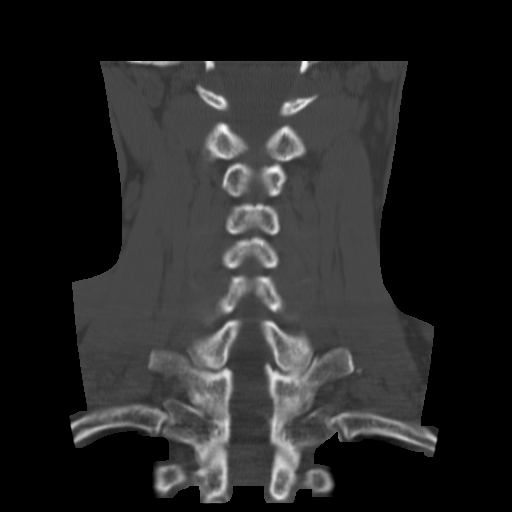

[13 of 37 positions shown; findings below may reference images not displayed]

FINDINGS: There is no evidence for acute infarction, intracranial
hemorrhage, mass lesion, hydrocephalus, or extra-axial fluid.
There is no atrophy or white matter disease.  The calvarium is
intact.  There is a small scalp hematoma in the frontal region.
There is no sinus or mastoid disease.
IMPRESSION: No acute intracranial findings.

CT MAXILLOFACIAL
FINDINGS: No facial fracture or traumatic subluxation.  No blowout
injury is observed.  Orbital structures grossly negative.  No acute
sinus fluid.  The mandible and maxilla are intact. There is a
superficial nasal abrasion. There is mild chronic ethmoid sinus
disease.
IMPRESSION: No facial fracture is evident. Superficial nasal abrasions is
noted.

CT CERVICAL SPINE
FINDINGS: There is no visible cervical spine fracture or
traumatic subluxation.  Craniocervical junction and cervicothoracic
junction are intact.  Airway is midline.  No neck masses.  Lung
apices clear.
IMPRESSION: Negative.

## 2015-02-09 ENCOUNTER — Encounter (HOSPITAL_BASED_OUTPATIENT_CLINIC_OR_DEPARTMENT_OTHER): Payer: Self-pay

## 2015-02-09 ENCOUNTER — Emergency Department (HOSPITAL_BASED_OUTPATIENT_CLINIC_OR_DEPARTMENT_OTHER)
Admission: EM | Admit: 2015-02-09 | Discharge: 2015-02-09 | Discharge status: Against Medical Advice | Payer: Self-pay | Attending: Emergency Medicine | Admitting: Emergency Medicine

## 2015-02-09 DIAGNOSIS — R21 Rash and other nonspecific skin eruption: Secondary | ICD-10-CM | POA: Insufficient documentation

## 2015-02-09 DIAGNOSIS — G8929 Other chronic pain: Secondary | ICD-10-CM | POA: Insufficient documentation

## 2015-02-09 DIAGNOSIS — Z720 Tobacco use: Secondary | ICD-10-CM | POA: Insufficient documentation

## 2015-02-09 NOTE — ED Notes (Signed)
Called in lobby, no answer 

## 2015-02-09 NOTE — ED Notes (Signed)
Patient called x 3 in lobby, no answer

## 2015-02-09 NOTE — ED Notes (Signed)
Patient here complaining that he is covered up with poison ivy all over body and minimal to no rash noted to body. Also complains of right knee pain, hx of same and also states that he becomes very sleepy with driving.
